# Patient Record
Sex: Male | Born: 2000 | Race: White | Hispanic: No | Marital: Single
Health system: Southern US, Community
[De-identification: ages and names within clinical notes are randomized; demographics above are authoritative.]

---

## 2021-08-11 ENCOUNTER — Emergency Department (HOSPITAL_COMMUNITY)
Admission: EM | Admit: 2021-08-11 | Discharge: 2021-08-11 | Disposition: A | Discharge status: Home / Self Care | Payer: BC Managed Care – PPO | Attending: Emergency Medicine | Admitting: Emergency Medicine

## 2021-08-11 ENCOUNTER — Other Ambulatory Visit: Payer: Self-pay

## 2021-08-11 ENCOUNTER — Encounter (HOSPITAL_COMMUNITY): Payer: Self-pay

## 2021-08-11 ENCOUNTER — Emergency Department (HOSPITAL_COMMUNITY): Payer: BC Managed Care – PPO

## 2021-08-11 DIAGNOSIS — S60012A Contusion of left thumb without damage to nail, initial encounter: Secondary | ICD-10-CM | POA: Insufficient documentation

## 2021-08-11 DIAGNOSIS — Y93K1 Activity, walking an animal: Secondary | ICD-10-CM | POA: Diagnosis not present

## 2021-08-11 DIAGNOSIS — W548XXA Other contact with dog, initial encounter: Secondary | ICD-10-CM | POA: Diagnosis not present

## 2021-08-11 DIAGNOSIS — S6992XA Unspecified injury of left wrist, hand and finger(s), initial encounter: Secondary | ICD-10-CM | POA: Diagnosis present

## 2021-08-11 DIAGNOSIS — R2232 Localized swelling, mass and lump, left upper limb: Secondary | ICD-10-CM | POA: Insufficient documentation

## 2021-08-11 NOTE — ED Provider Notes (Signed)
[facility]  HOSPITAL-EMERGENCY DEPT Provider Note   CSN: [id number] Arrival date & time: 08/11/21  2048     History Chief Complaint  Patient presents with   Left Thumb Injury    Aaron Ayers is a 20 y.o. male.  The history is provided by the patient and medical records.   20 y.o. M presenting to the ED for left thumb injury.  Patient states he was walking his dog when leash got loose so he dove after the leash to stop his dog from running away and landed on his left thumb.  No head injury or LOC.  States immediate pain and swelling of left thumb.  Denies numbness.  He has had prior surgery x2 to this thumb in the past in [place].  He is right hand dominant.  History reviewed. No pertinent past medical history.  There are no problems to display for this patient.     History reviewed. No pertinent family history.     Home Medications Prior to Admission medications   Not on File    Allergies    Patient has no known allergies.  Review of Systems   Review of Systems  Musculoskeletal:  Positive for arthralgias.  All other systems reviewed and are negative.  Physical Exam Updated Vital Signs BP (!) 142/71 (BP Location: Right Arm)   Pulse 87   Temp 98.1 F (36.7 C) (Oral)   Resp 20   SpO2 99%   Physical Exam Vitals and nursing note reviewed.  Constitutional:      Appearance: He is well-developed.  HENT:     Head: Normocephalic and atraumatic.  Eyes:     Conjunctiva/sclera: Conjunctivae normal.     Pupils: Pupils are equal, round, and reactive to light.  Cardiovascular:     Rate and Rhythm: Normal rate and regular rhythm.     Heart sounds: Normal heart sounds.  Pulmonary:     Effort: Pulmonary effort is normal. No respiratory distress.     Breath sounds: Normal breath sounds. No rhonchi.  Musculoskeletal:        General: Normal range of motion.     Cervical back: Normal range of motion.     Comments: Left thumb is diffusely swollen,  worse along proximal phalanx, there is early bruising, no bony deformity, open wound, or laceration, able to flex/extend and oppose but limited due to swelling, normal cap refill and distal sensation  Skin:    General: Skin is warm and dry.  Neurological:     Mental Status: He is alert and oriented to person, place, and time.    ED Results / Procedures / Treatments   Labs (all labs ordered are listed, but only abnormal results are displayed) Labs Reviewed - No data to display  EKG None  Radiology DG Finger Thumb Left  Result Date: 08/11/2021 CLINICAL DATA:  Thumb injury.  Fall. EXAM: LEFT THUMB 2+V COMPARISON:  None. FINDINGS: Postoperative changes in the left thumb proximal phalanx. No acute fracture, subluxation or dislocation. Soft tissues are intact. Screw seen within the proximal phalanx. IMPRESSION: No acute bony abnormality. Electronically Signed   By: Charlett Nose M.D.   On: 08/11/2021 22:23    Procedures Procedures   Medications Ordered in ED Medications - No data to display  ED Course  I have reviewed the triage vital signs and the nursing notes.  Pertinent labs & imaging results that were available during my care of the patient were reviewed by me and considered in  my medical decision making (see chart for details).    MDM Rules/Calculators/A&P                           20 year old male here with left thumb injury during fall while trying to catch dog leash.  There was no head injury or loss of consciousness.  He does have some swelling or bruising to the left thumb but there is no acute bony deformity.  X-ray is negative.  Has had prior surgery on this thumb in the past in [place], does not have a local hand specialist.  We will refer to on-call hand surgery and have him follow-up in clinic.  Placed in thumb spica splint for now.  Tylenol or Motrin as needed for pain.  Work note provided.  Can return here for any new or acute changes.  Final Clinical Impression(s) /  ED Diagnoses Final diagnoses:  Injury of left thumb, initial encounter    Rx / DC Orders ED Discharge Orders     None        Garlon Hatchet, PA-C 08/11/21 2351    Charlynne Pander, MD 08/12/21 [phone]

## 2021-08-11 NOTE — Discharge Instructions (Signed)
Tylenol or motrin as needed for pain.  Can wear splint for now. Follow-up with hand specialist, Dr. Yehuda Budd-- call his office in the morning for appt. Return here for new concerns.

## 2021-08-11 NOTE — ED Notes (Signed)
Left wrist brace applied and snugged to comfort.

## 2021-08-11 NOTE — ED Triage Notes (Signed)
Pt c/o left thumb injury. Pt states he fell on it when he was pulling his dog away from another dog. Pt has had 2 previous injuries to same thumb.

## 2022-10-20 IMAGING — CR DG FINGER THUMB 2+V*L*
3 series · 3 of 3 positions shown · non-contrast
Comparison: None.

CLINICAL DATA: Thumb injury.  Fall.

EXAM:
LEFT THUMB 2+V

[x finger obl left]
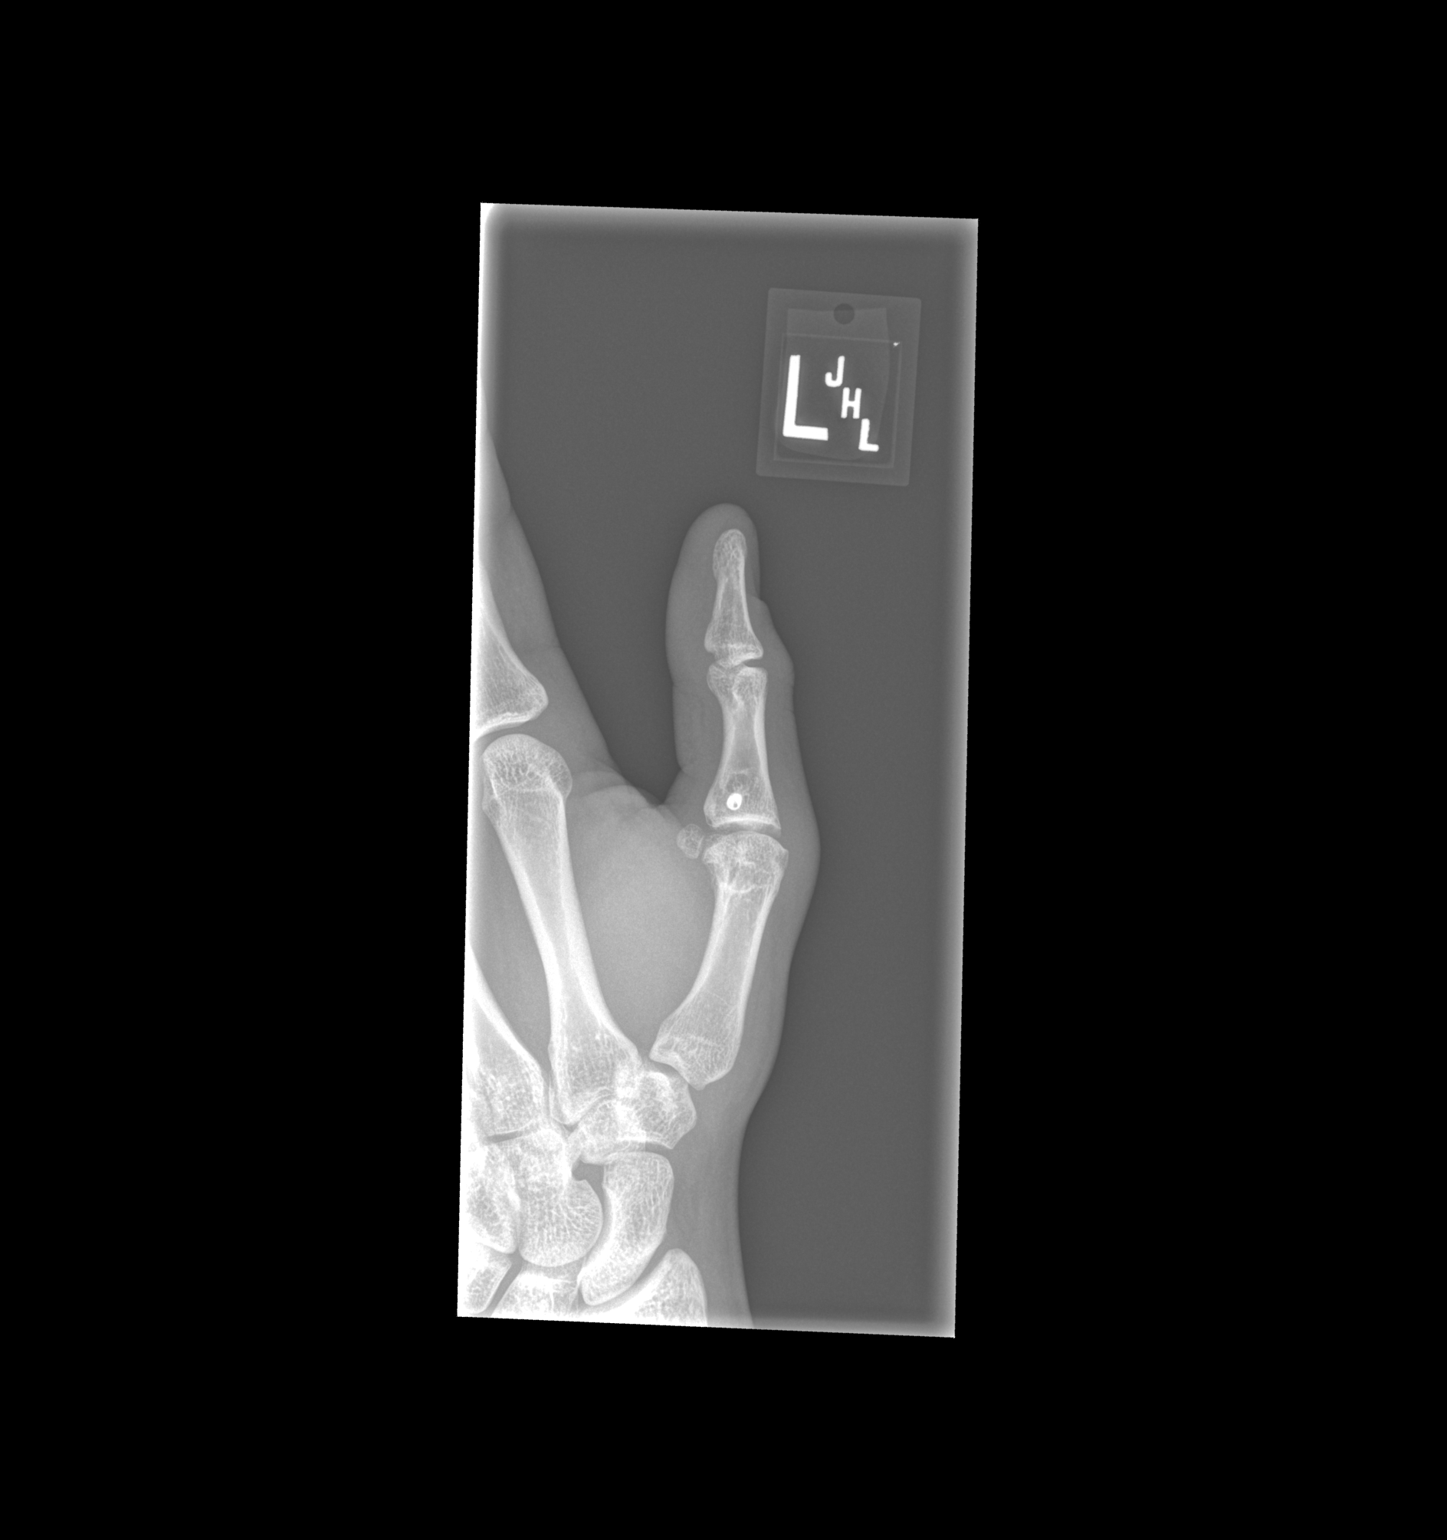

[x finger lat left]
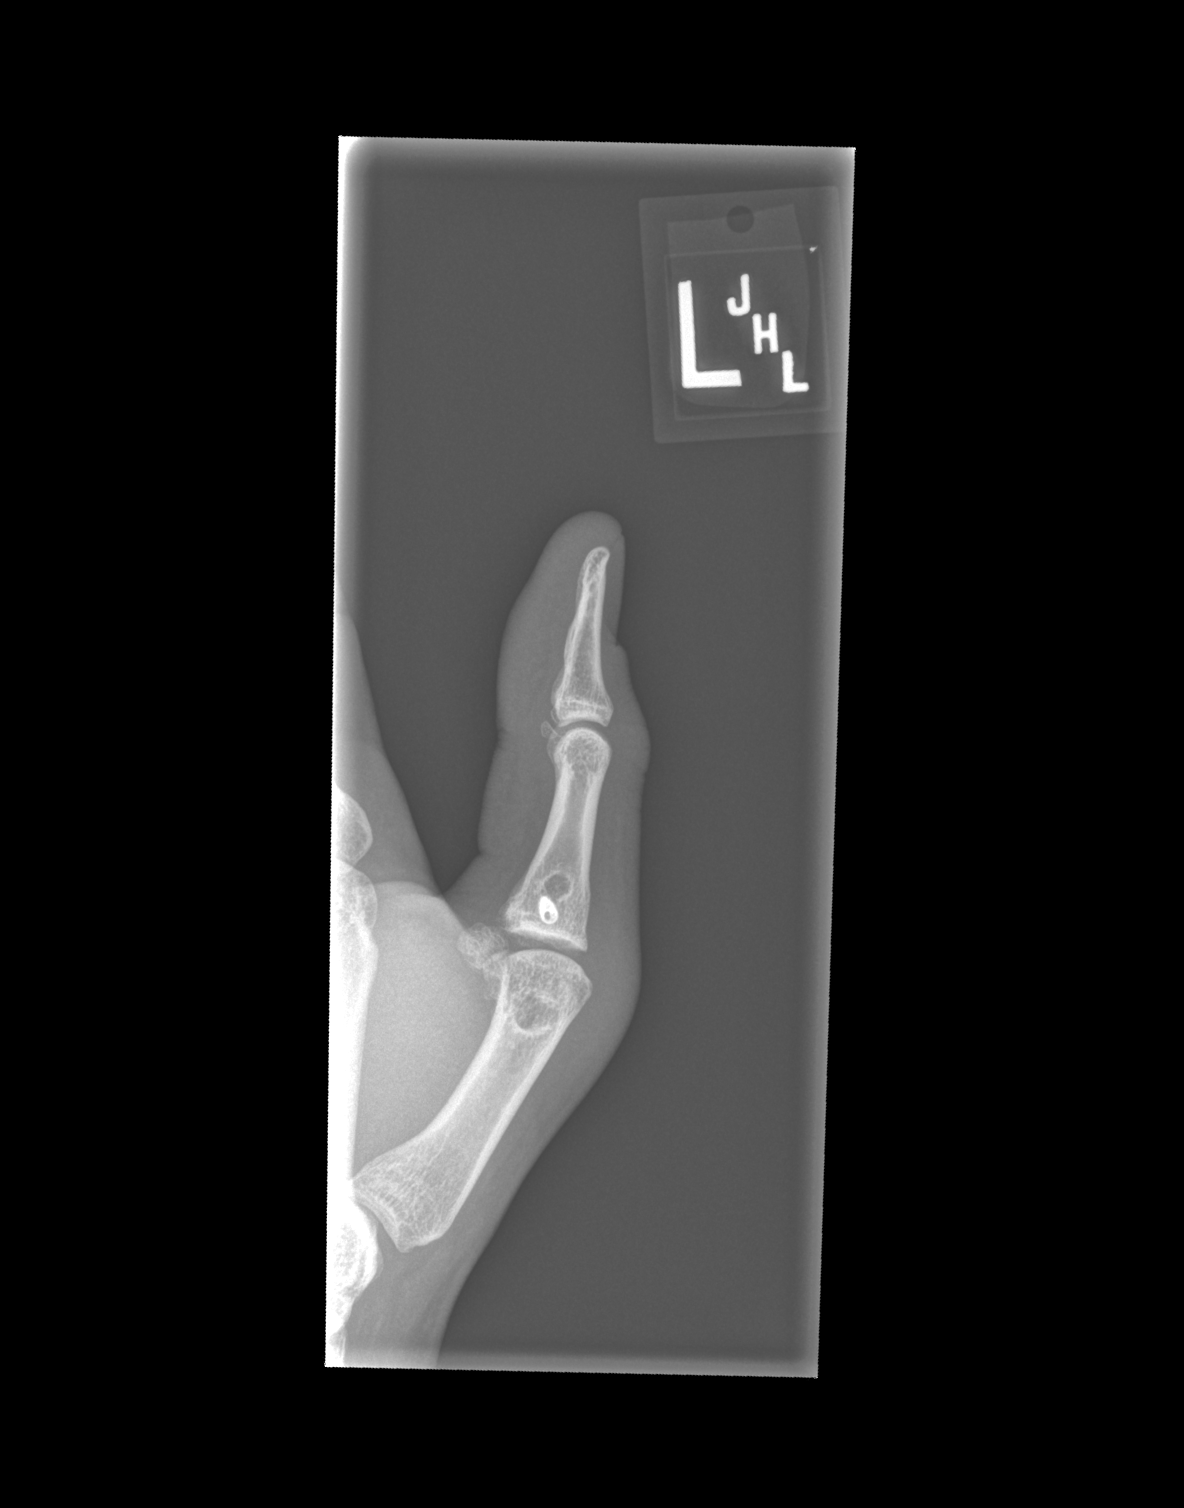

[x finger pa left]
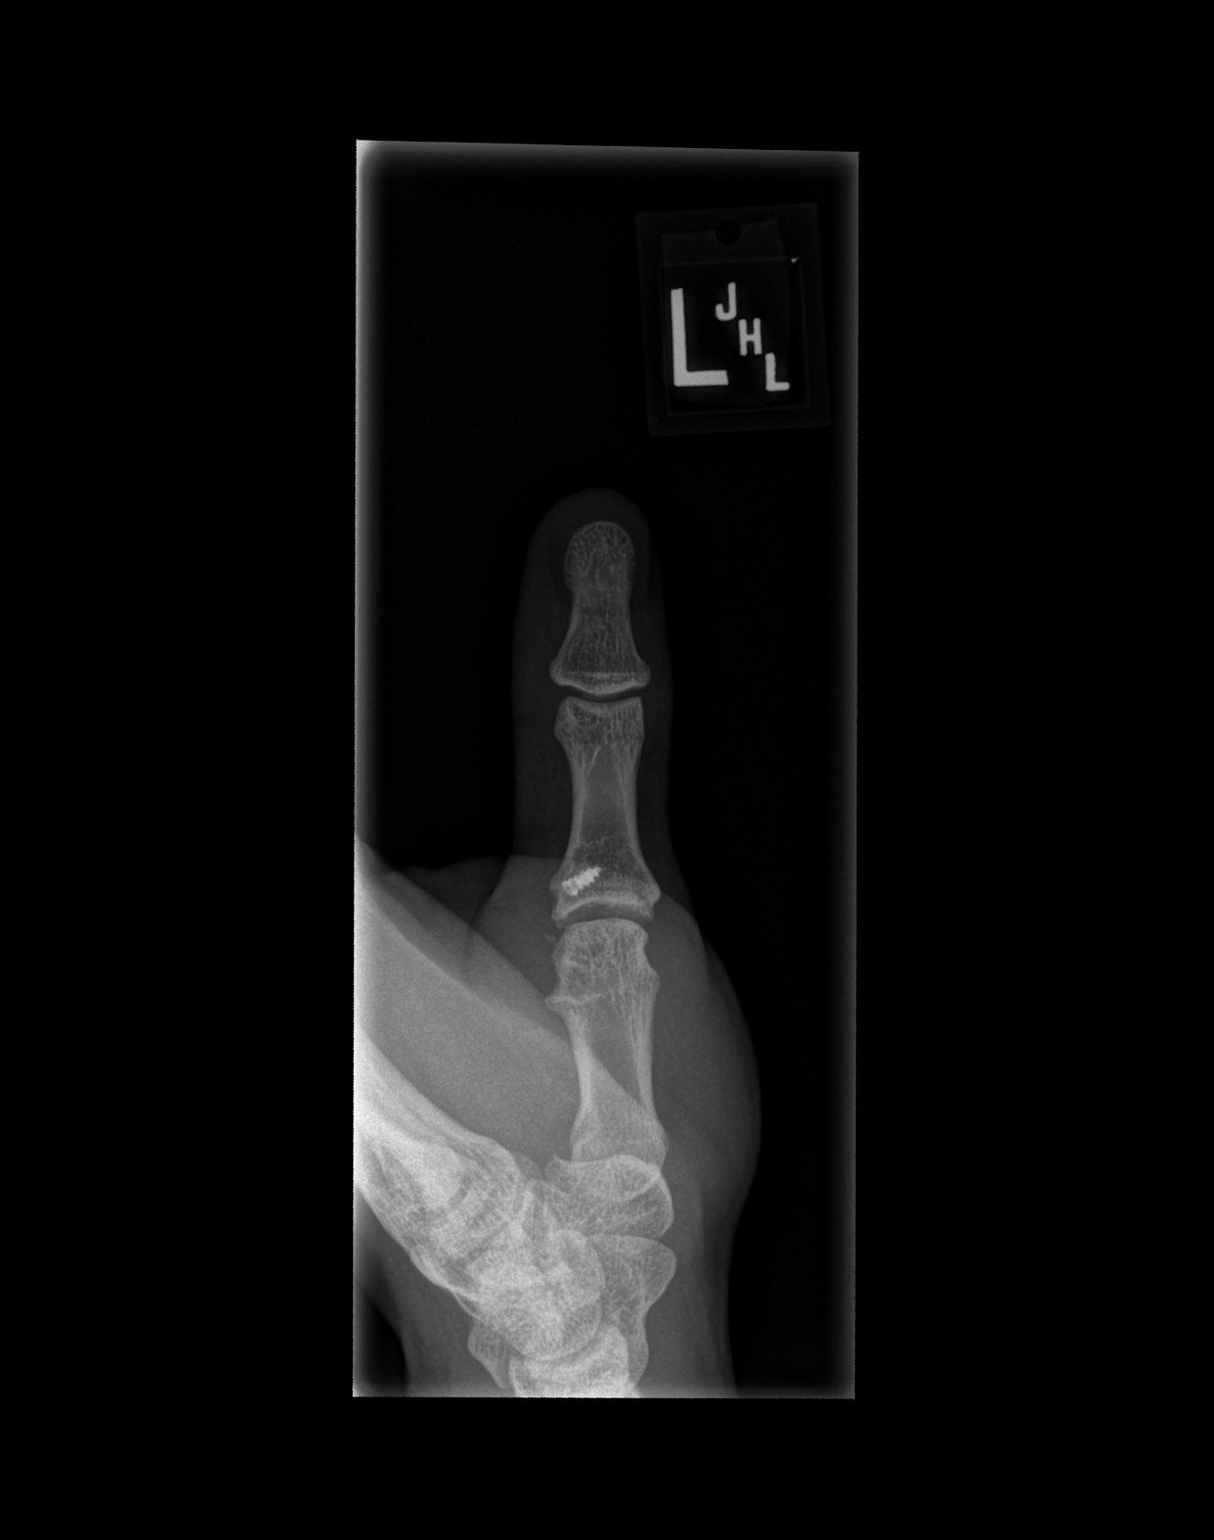

[3 of 3 positions shown; findings below may reference images not displayed]

FINDINGS: Postoperative changes in the left thumb proximal phalanx. No acute
fracture, subluxation or dislocation. Soft tissues are intact. Screw
seen within the proximal phalanx.
IMPRESSION: No acute bony abnormality.
